# Patient Record
Sex: Male | Born: 1995 | Race: White | Hispanic: No | Marital: Single | Smoking: Current every day smoker
Health system: Southern US, Community
[De-identification: ages and names within clinical notes are randomized; demographics above are authoritative.]

## PROBLEM LIST (undated history)

## (undated) ENCOUNTER — Ambulatory Visit (HOSPITAL_COMMUNITY): Disposition: A | Discharge status: Home / Self Care | Payer: Self-pay

## (undated) DIAGNOSIS — R569 Unspecified convulsions: Secondary | ICD-10-CM

## (undated) DIAGNOSIS — F329 Major depressive disorder, single episode, unspecified: Secondary | ICD-10-CM

## (undated) DIAGNOSIS — T50901A Poisoning by unspecified drugs, medicaments and biological substances, accidental (unintentional), initial encounter: Secondary | ICD-10-CM

## (undated) DIAGNOSIS — F32A Depression, unspecified: Secondary | ICD-10-CM

---
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
## 2014-11-08 IMAGING — CR DG CHEST 1V
1 series · 1 of 1 positions shown · non-contrast
Comparison: None.

CLINICAL DATA: Respiratory arrest.

EXAM:
CHEST - 1 VIEW

[ap portable]
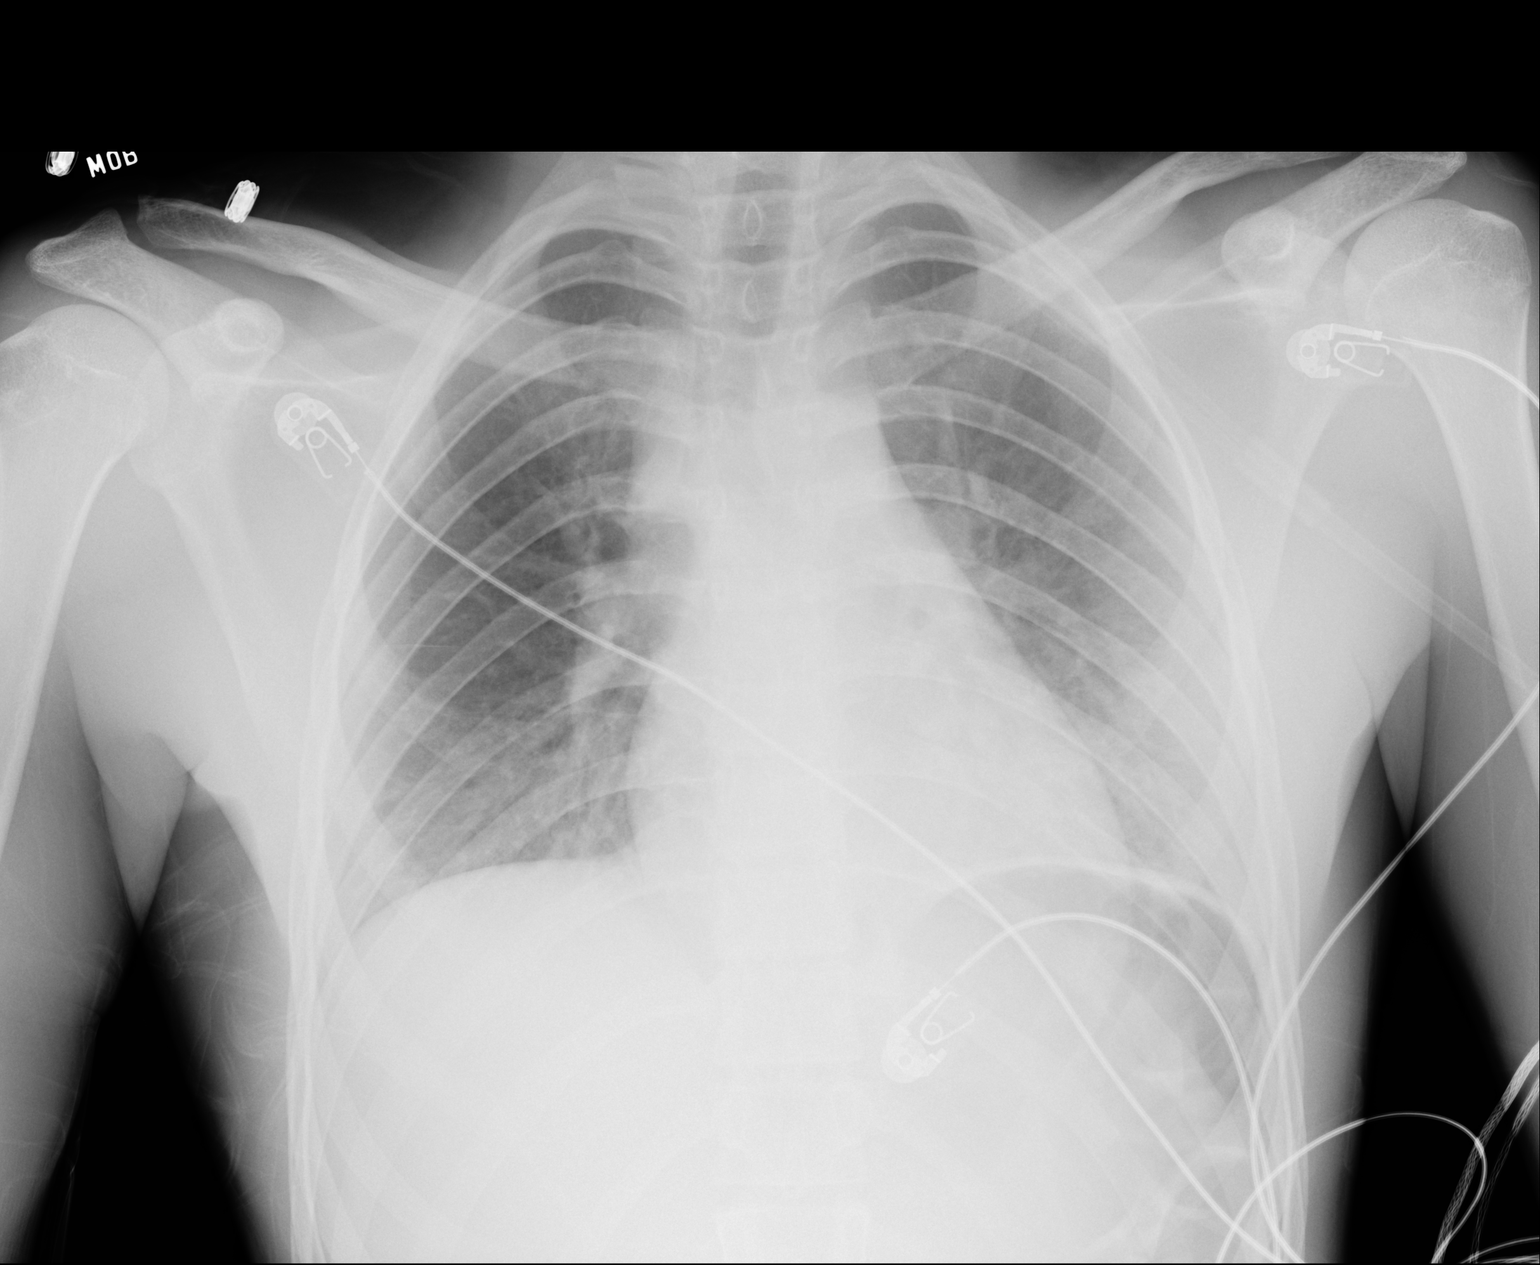

[1 of 1 positions shown; findings below may reference images not displayed]

FINDINGS: Heart size and mediastinal contours within normal range.
Hypoaeration with elevated hemidiaphragms. Mild left greater than
right lower lobe opacities. No definite effusion. No pneumothorax.
No acute osseous finding.
IMPRESSION: Mild left greater than right lower lobe opacities are nonspecific;
atelectasis, aspiration, edema, or infiltrate.

## 2014-11-09 IMAGING — CR DG CHEST 1V PORT
1 series · 1 of 1 positions shown · non-contrast
Comparison: 09/15/2013

CLINICAL DATA: Followup aspiration

EXAM:
PORTABLE CHEST - 1 VIEW

[AP]
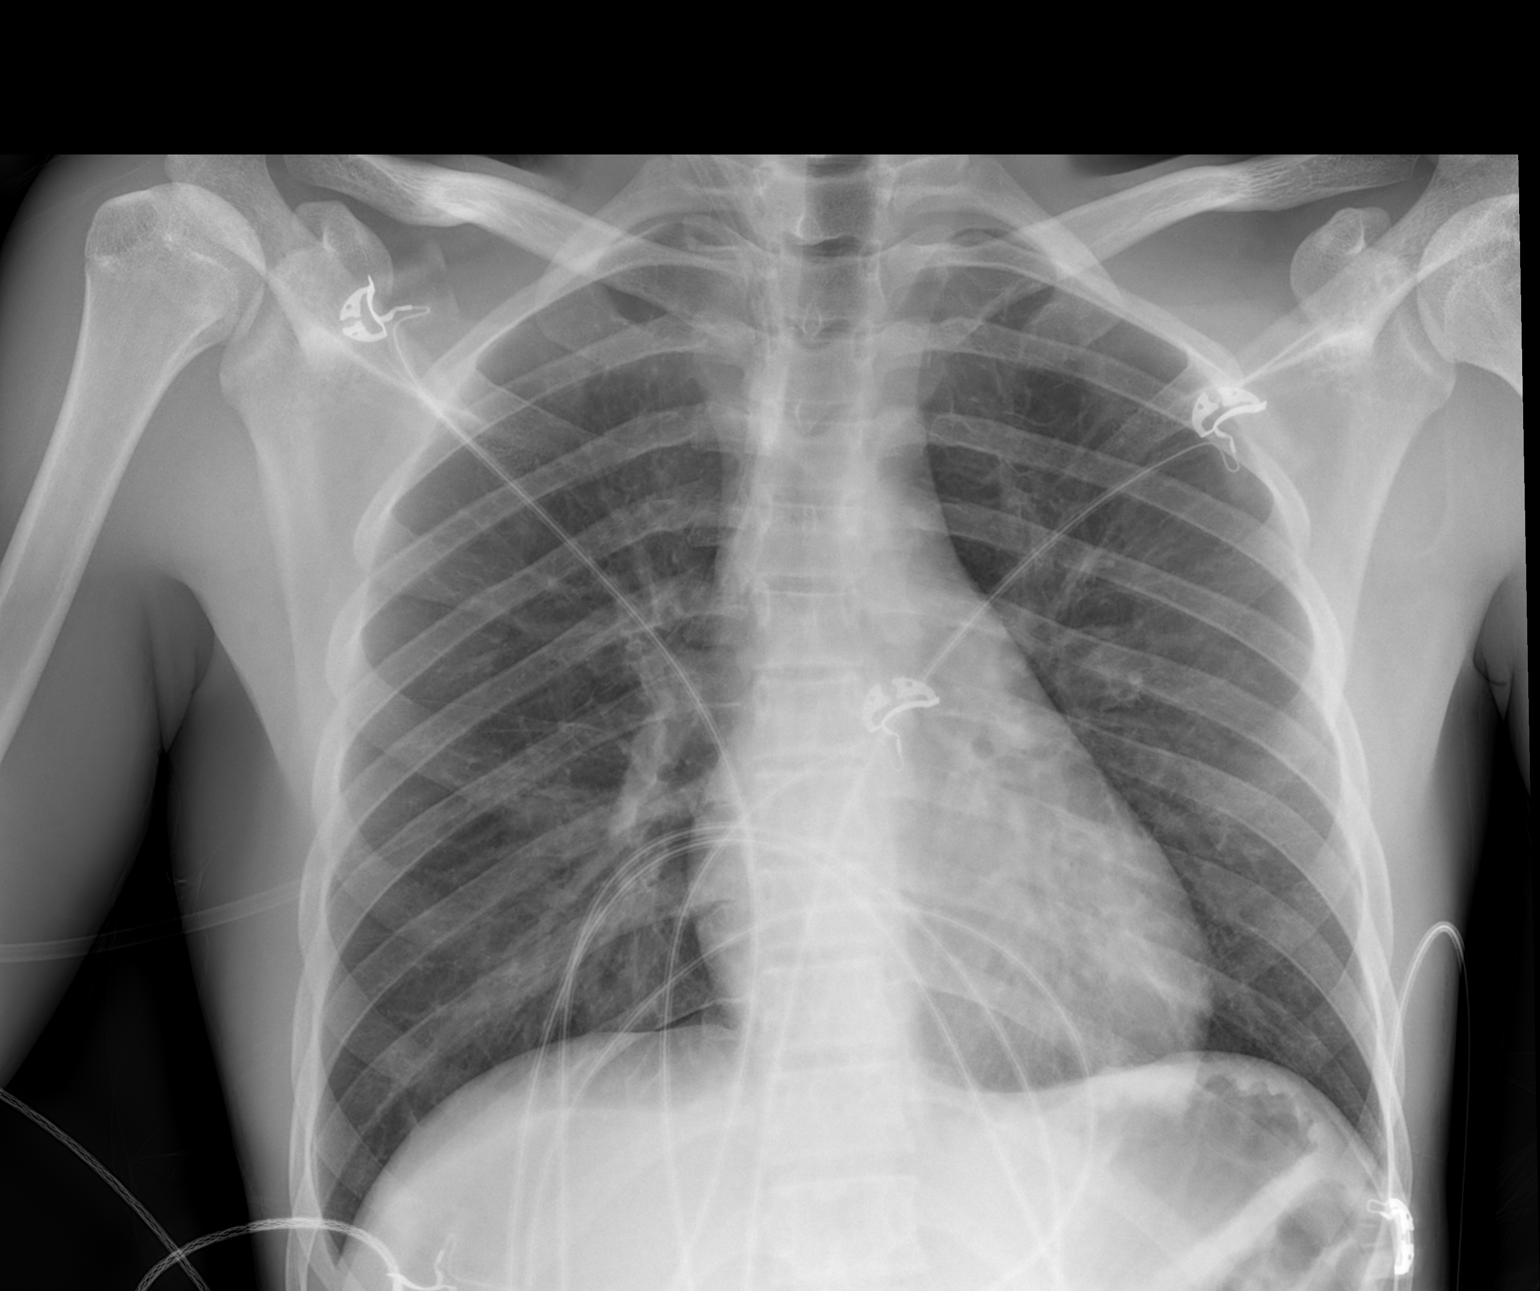

[1 of 1 positions shown; findings below may reference images not displayed]

FINDINGS: Interval improvement in left lower lobe infiltrate. Small amount of
residual infiltrate is present on the left. Right lung is clear. No
effusion.
IMPRESSION: Interval improvement in left lower lobe infiltrate.
